# Patient Record
Sex: Female | Born: 1990 | Race: White | Hispanic: No | Marital: Single | Smoking: Never smoker
Health system: Southern US, Community
[De-identification: ages and names within clinical notes are randomized; demographics above are authoritative.]

---

## 2013-06-22 ENCOUNTER — Encounter: Payer: Self-pay | Admitting: Sports Medicine

## 2013-06-22 ENCOUNTER — Ambulatory Visit (INDEPENDENT_AMBULATORY_CARE_PROVIDER_SITE_OTHER): Payer: BC Managed Care – PPO

## 2013-06-22 ENCOUNTER — Ambulatory Visit (INDEPENDENT_AMBULATORY_CARE_PROVIDER_SITE_OTHER): Payer: BC Managed Care – PPO | Admitting: Sports Medicine

## 2013-06-22 VITALS — BP 115/73 | HR 74 | Ht 64.0 in | Wt 250.0 lb

## 2013-06-22 DIAGNOSIS — M545 Low back pain, unspecified: Secondary | ICD-10-CM | POA: Insufficient documentation

## 2013-06-22 DIAGNOSIS — R209 Unspecified disturbances of skin sensation: Secondary | ICD-10-CM

## 2013-06-22 DIAGNOSIS — E669 Obesity, unspecified: Secondary | ICD-10-CM

## 2013-06-22 DIAGNOSIS — F411 Generalized anxiety disorder: Secondary | ICD-10-CM

## 2013-06-22 DIAGNOSIS — Z299 Encounter for prophylactic measures, unspecified: Secondary | ICD-10-CM | POA: Insufficient documentation

## 2013-06-22 DIAGNOSIS — F419 Anxiety disorder, unspecified: Secondary | ICD-10-CM | POA: Insufficient documentation

## 2013-06-22 MED ORDER — BUSPIRONE HCL 15 MG PO TABS: 15.0000 mg | ORAL_TABLET | Freq: Three times a day (TID) | ORAL | Status: AC

## 2013-06-22 MED ORDER — GABAPENTIN 300 MG PO CAPS: ORAL_CAPSULE | ORAL | Status: AC

## 2013-06-22 MED ORDER — PHENTERMINE HCL 37.5 MG PO CAPS: 37.5000 mg | ORAL_CAPSULE | ORAL | Status: AC

## 2013-06-22 MED ORDER — ESCITALOPRAM OXALATE 20 MG PO TABS: 20.0000 mg | ORAL_TABLET | Freq: Every day | ORAL | Status: AC

## 2013-06-22 NOTE — Progress Notes (Signed)
  Subjective:    CC: Establish care.   HPI:  Anxiety: Stable on Lexapro and buspirone, needs a refill.  Obesity: Has tried exercising and dieting, desires physician assistance.  Low-back pain: Occasionally radiates around the left leg down to the knee but not past, worse with sitting for long periods of time and driving, worse with flexion, occasionally gets numbness on the lateral left thigh. It is moderate, persistent.  Preventive measure: Last Pap smear was negative last year, she has had greater than 3 negative and is scheduled to see our OB/GYNs here in the building.  Past medical history, Surgical history, Family history not pertinant except as noted below, Social history, Allergies, and medications have been entered into the medical record, reviewed, and no changes needed.   Review of Systems: No headache, visual changes, nausea, vomiting, diarrhea, constipation, dizziness, abdominal pain, skin rash, fevers, chills, night sweats, swollen lymph nodes, weight loss, chest pain, body aches, joint swelling, muscle aches, shortness of breath, mood changes, visual or auditory hallucinations.  Objective:    General: Well Developed, well nourished, and in no acute distress.  Neuro: Alert and oriented x3, extra-ocular muscles intact, sensation grossly intact.  HEENT: Normocephalic, atraumatic, pupils equal round reactive to light, neck supple, no masses, no lymphadenopathy, thyroid nonpalpable.  Skin: Warm and dry, no rashes noted.  Cardiac: Regular rate and rhythm, no murmurs rubs or gallops.  Respiratory: Clear to auscultation bilaterally. Not using accessory muscles, speaking in full sentences.  Abdominal: Soft, nontender, nondistended, positive bowel sounds, no masses, no organomegaly.  Back Exam:  Inspection: Unremarkable  Motion: Flexion 45 deg, Extension 45 deg, Side Bending to 45 deg bilaterally,  Rotation to 45 deg bilaterally  SLR laying: Negative  XSLR laying: Negative    Palpable tenderness: None. FABER: negative. Sensory change: Gross sensation intact to all lumbar and sacral dermatomes.  Reflexes: 2+ at both patellar tendons, 2+ at achilles tendons, Babinski's downgoing.  Strength at foot  Plantar-flexion: 5/5 Dorsi-flexion: 5/5 Eversion: 5/5 Inversion: 5/5  Leg strength  Quad: 5/5 Hamstring: 5/5 Hip flexor: 5/5 Hip abductors: 5/5  Gait unremarkable.  Impression and Recommendations:    The patient was counselled, risk factors were discussed, anticipatory guidance given.

## 2013-06-22 NOTE — Assessment & Plan Note (Signed)
Refilling BuSpar and Lexapro.

## 2013-06-22 NOTE — Assessment & Plan Note (Signed)
Phentermine, nutrition referral, exercise prescription. Return monthly for weight checks and refills 

## 2013-06-22 NOTE — Assessment & Plan Note (Signed)
Symptoms do sound discogenic however meralgia paresthetica is a possibility. Herniated disc rehabilitation, gabapentin, x-rays. Return in one month to reevaluate.

## 2013-06-22 NOTE — Assessment & Plan Note (Signed)
This is a healthy female. We are obtaining some screening blood work, she had a Pap smear last year that was normal and has had at least 3 negatives so she is good for the next 1-2 years.

## 2013-06-23 ENCOUNTER — Telehealth: Payer: Self-pay

## 2013-06-23 LAB — LIPID PANEL
Cholesterol: 183 mg/dL (ref 0–200)
HDL: 65 mg/dL (ref >39)
LDL Cholesterol: 98 mg/dL (ref 0–99)
Total CHOL/HDL Ratio: 2.8 ratio
Triglycerides: 100 mg/dL (ref <150)
VLDL: 20 mg/dL (ref 0–40)

## 2013-06-23 LAB — TSH: TSH: 1.357 u[IU]/mL (ref 0.350–4.500)

## 2013-06-23 LAB — CBC
HCT: 38.5 % (ref 36.0–46.0)
Hemoglobin: 13 g/dL (ref 12.0–15.0)
MCH: 28.3 pg (ref 26.0–34.0)
MCHC: 33.8 g/dL (ref 30.0–36.0)
MCV: 83.9 fL (ref 78.0–100.0)
Platelets: 276 10*3/uL (ref 150–400)
RBC: 4.59 MIL/uL (ref 3.87–5.11)
RDW: 14.7 % (ref 11.5–15.5)
WBC: 6.8 10*3/uL (ref 4.0–10.5)

## 2013-06-23 LAB — COMPREHENSIVE METABOLIC PANEL
ALT: 8 U/L (ref 0–35)
Albumin: 4 g/dL (ref 3.5–5.2)
BUN: 12 mg/dL (ref 6–23)
CO2: 26 mEq/L (ref 19–32)
Calcium: 9 mg/dL (ref 8.4–10.5)
Chloride: 102 mEq/L (ref 96–112)
Creat: 0.67 mg/dL (ref 0.50–1.10)
Glucose, Bld: 105 mg/dL — ABNORMAL HIGH (ref 70–99)
Potassium: 4.3 mEq/L (ref 3.5–5.3)
Sodium: 139 mEq/L (ref 135–145)

## 2013-06-23 LAB — HEMOGLOBIN A1C
Hgb A1c MFr Bld: 5.7 % — ABNORMAL HIGH (ref <5.7)
Mean Plasma Glucose: 117 mg/dL — ABNORMAL HIGH (ref <117)

## 2013-06-23 LAB — COMPREHENSIVE METABOLIC PANEL WITH GFR
AST: 14 U/L (ref 0–37)
Alkaline Phosphatase: 63 U/L (ref 39–117)
Total Bilirubin: 0.4 mg/dL (ref 0.2–1.2)
Total Protein: 6.9 g/dL (ref 6.0–8.3)

## 2013-06-23 NOTE — Telephone Encounter (Signed)
Sounds ok

## 2013-06-23 NOTE — Telephone Encounter (Signed)
This is a common side effect, it will resolve, she should take the medicine about 2 hours earlier tonight and every night.

## 2013-06-23 NOTE — Telephone Encounter (Signed)
Patient states she is not going to take the gabapentin. She states she does not have the pain very often. She is not going to take anything for it.

## 2013-06-23 NOTE — Telephone Encounter (Signed)
Brittany Howell states she took the gabapentin last night and woke this morning with dizziness. She believes it is the gabapentin that is causing the dizziness. Please advise.

## 2013-06-28 ENCOUNTER — Telehealth: Payer: Self-pay | Admitting: *Deleted

## 2013-06-28 MED ORDER — NORGESTIMATE-ETH ESTRADIOL 0.25-35 MG-MCG PO TABS: 1.0000 | ORAL_TABLET | Freq: Every day | ORAL | Status: DC

## 2013-06-28 NOTE — Telephone Encounter (Signed)
Sent med to pharmacy  

## 2014-07-05 ENCOUNTER — Other Ambulatory Visit: Payer: Self-pay | Admitting: Sports Medicine

## 2015-01-28 IMAGING — CR DG LUMBAR SPINE COMPLETE 4+V
5 series · 5 of 5 positions shown · non-contrast
Comparison: No priors.

CLINICAL DATA: Low back pain.  Left leg numbness.

EXAM:
LUMBAR SPINE - COMPLETE 4+ VIEW

[view not recorded (1 of 5)]
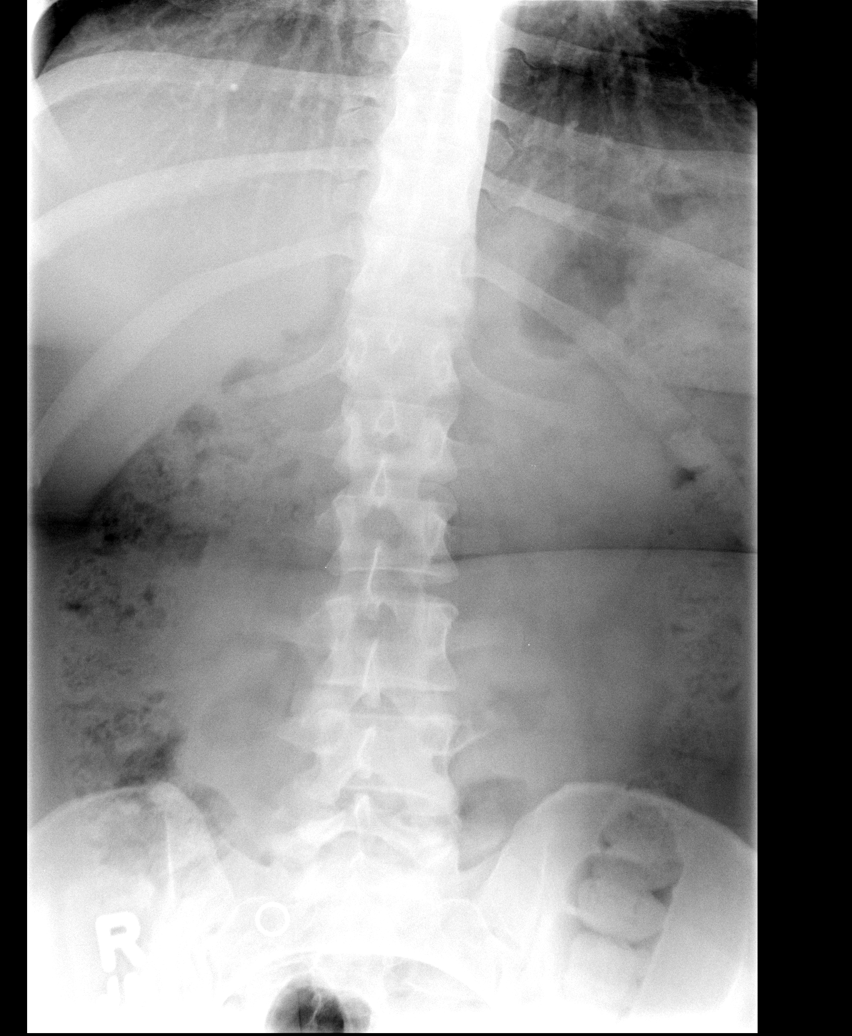

[view not recorded (2 of 5)]
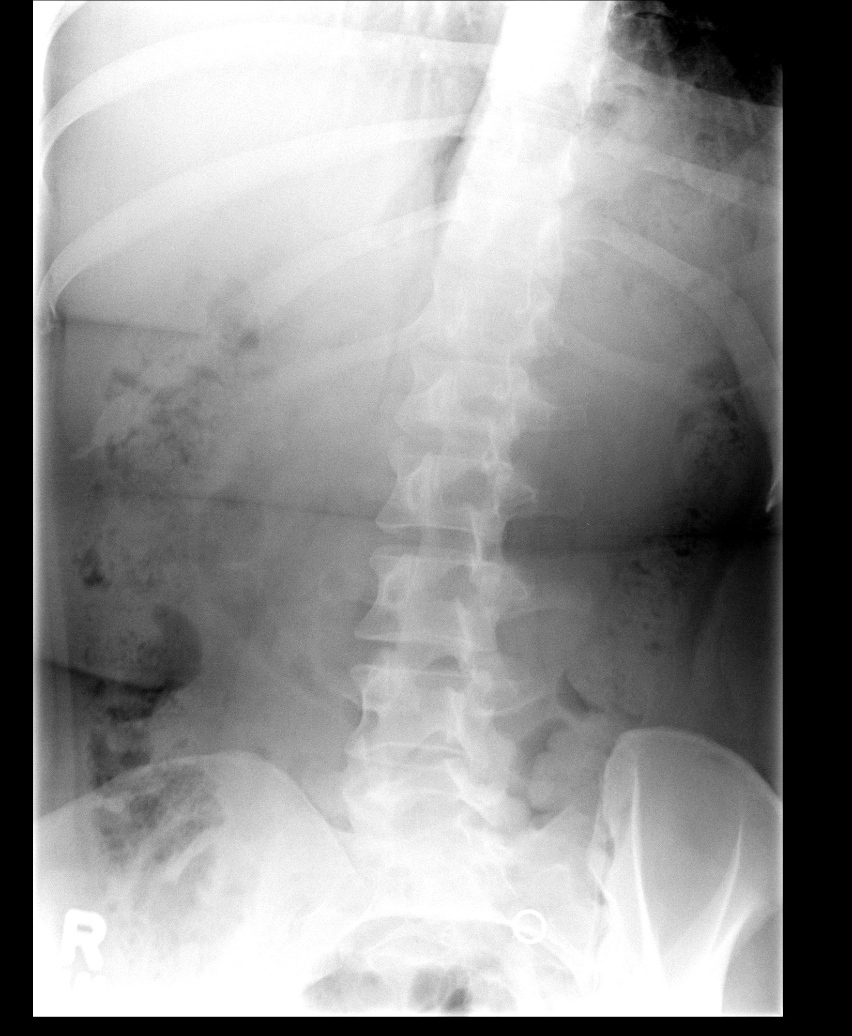

[view not recorded (3 of 5)]
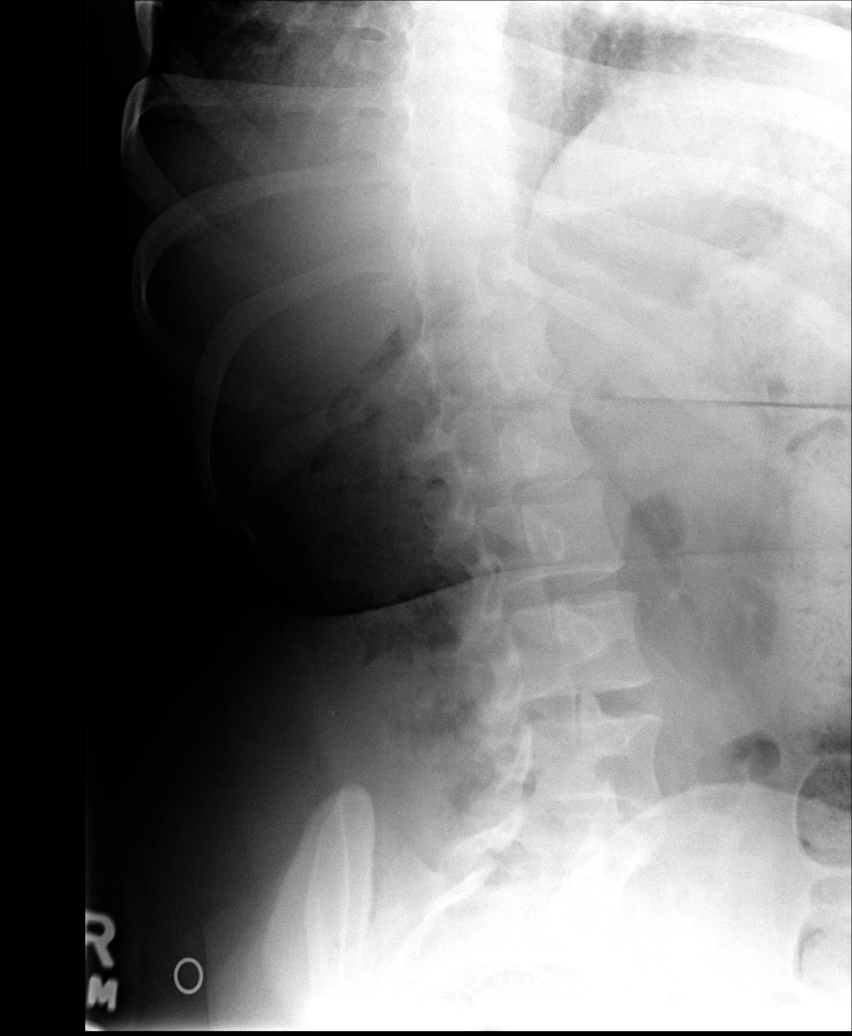

[view not recorded (4 of 5)]
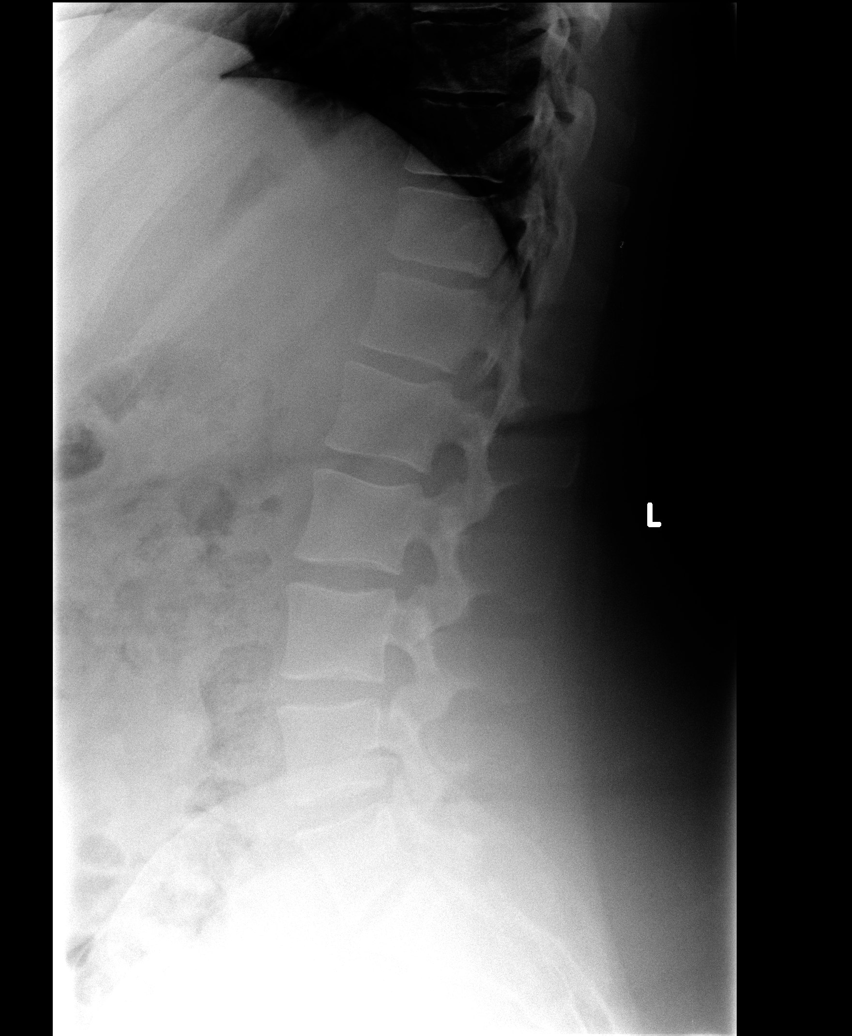

[view not recorded (5 of 5)]
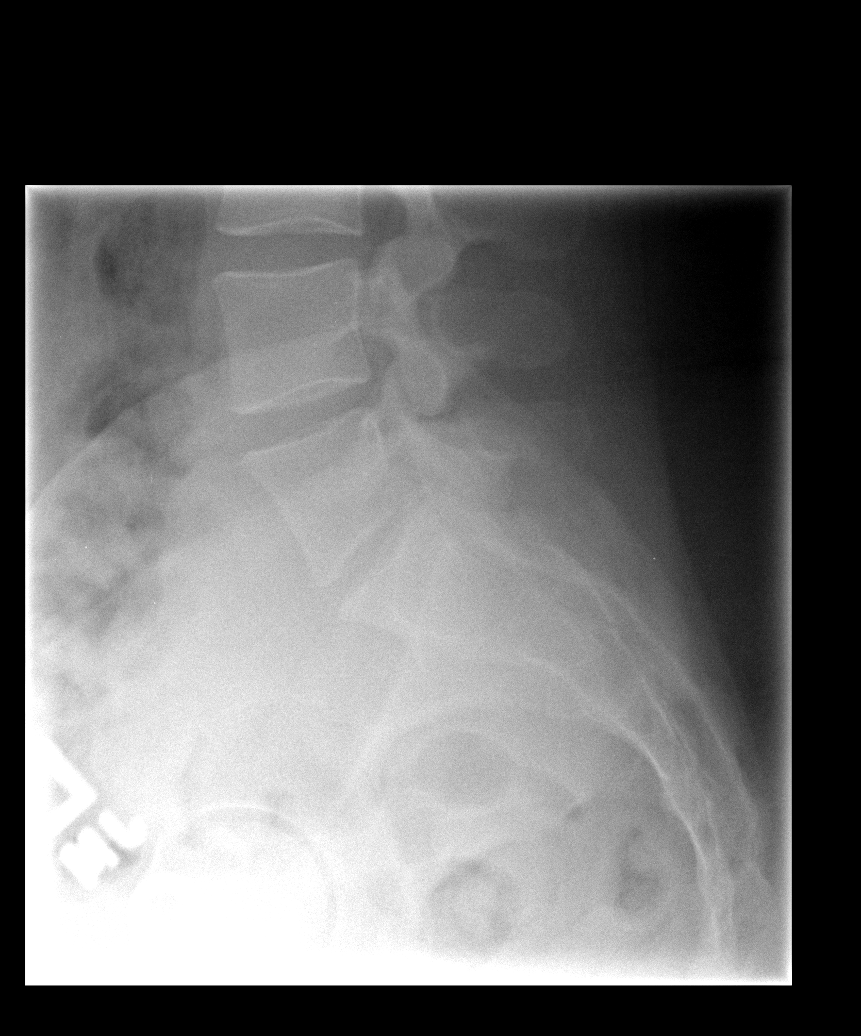

[5 of 5 positions shown; findings below may reference images not displayed]

FINDINGS: Five views of the lumbar spine demonstrate no acute displaced
fracture or compression type fracture. No defects of the pars
interarticularis. Alignment is anatomic. No significant degenerative
changes are noted.
IMPRESSION: 1. No acute radiographic abnormality of the lumbar spine.
